# Patient Record
Sex: Female | Born: 1940 | Race: Black or African American | Hispanic: No | Smoking: Former smoker
Health system: Southern US, Community
[De-identification: ages and names within clinical notes are randomized; demographics above are authoritative.]

## PROBLEM LIST (undated history)

## (undated) DIAGNOSIS — I10 Essential (primary) hypertension: Secondary | ICD-10-CM

## (undated) HISTORY — PX: KNEE REPAIR EXTENSOR MECHANISM: SHX6613

---

## 2018-06-04 ENCOUNTER — Emergency Department (HOSPITAL_COMMUNITY): Payer: No Typology Code available for payment source

## 2018-06-04 ENCOUNTER — Encounter (HOSPITAL_COMMUNITY): Payer: Self-pay | Admitting: Emergency Medicine

## 2018-06-04 ENCOUNTER — Emergency Department (HOSPITAL_COMMUNITY)
Admission: EM | Admit: 2018-06-04 | Discharge: 2018-06-04 | Disposition: A | Discharge status: Home / Self Care | Payer: No Typology Code available for payment source | Attending: Emergency Medicine | Admitting: Emergency Medicine

## 2018-06-04 DIAGNOSIS — Y999 Unspecified external cause status: Secondary | ICD-10-CM | POA: Diagnosis not present

## 2018-06-04 DIAGNOSIS — Y92410 Unspecified street and highway as the place of occurrence of the external cause: Secondary | ICD-10-CM | POA: Insufficient documentation

## 2018-06-04 DIAGNOSIS — I10 Essential (primary) hypertension: Secondary | ICD-10-CM | POA: Diagnosis not present

## 2018-06-04 DIAGNOSIS — Z87891 Personal history of nicotine dependence: Secondary | ICD-10-CM | POA: Diagnosis not present

## 2018-06-04 DIAGNOSIS — I160 Hypertensive urgency: Secondary | ICD-10-CM | POA: Diagnosis not present

## 2018-06-04 DIAGNOSIS — S0990XA Unspecified injury of head, initial encounter: Secondary | ICD-10-CM | POA: Insufficient documentation

## 2018-06-04 DIAGNOSIS — Y939 Activity, unspecified: Secondary | ICD-10-CM | POA: Insufficient documentation

## 2018-06-04 DIAGNOSIS — Z96651 Presence of right artificial knee joint: Secondary | ICD-10-CM | POA: Insufficient documentation

## 2018-06-04 DIAGNOSIS — S8011XA Contusion of right lower leg, initial encounter: Secondary | ICD-10-CM

## 2018-06-04 DIAGNOSIS — S8991XA Unspecified injury of right lower leg, initial encounter: Secondary | ICD-10-CM | POA: Diagnosis present

## 2018-06-04 HISTORY — DX: Essential (primary) hypertension: I10

## 2018-06-04 LAB — CBC WITH DIFFERENTIAL/PLATELET
Abs Immature Granulocytes: 0 10*3/uL (ref 0.0–0.1)
BASOS ABS: 0 10*3/uL (ref 0.0–0.1)
Basophils Relative: 1 %
Eosinophils Absolute: 0 10*3/uL (ref 0.0–0.7)
Eosinophils Relative: 1 %
HEMATOCRIT: 38.1 % (ref 36.0–46.0)
Hemoglobin: 12.3 g/dL (ref 12.0–15.0)
Immature Granulocytes: 0 %
LYMPHS PCT: 16 %
Lymphs Abs: 1 10*3/uL (ref 0.7–4.0)
MCH: 27.6 pg (ref 26.0–34.0)
MCHC: 32.3 g/dL (ref 30.0–36.0)
MCV: 85.4 fL (ref 78.0–100.0)
MONO ABS: 0.3 10*3/uL (ref 0.1–1.0)
Monocytes Relative: 5 %
NEUTROS ABS: 4.9 10*3/uL (ref 1.7–7.7)
Neutrophils Relative %: 77 %
PLATELETS: 135 10*3/uL — AB (ref 150–400)
RBC: 4.46 MIL/uL (ref 3.87–5.11)
RDW: 16.8 % — ABNORMAL HIGH (ref 11.5–15.5)
WBC: 6.3 10*3/uL (ref 4.0–10.5)

## 2018-06-04 LAB — BASIC METABOLIC PANEL
Anion gap: 10 (ref 5–15)
BUN: 18 mg/dL (ref 8–23)
CALCIUM: 9.4 mg/dL (ref 8.9–10.3)
CO2: 26 mmol/L (ref 22–32)
Chloride: 104 mmol/L (ref 98–111)
Creatinine, Ser: 0.79 mg/dL (ref 0.44–1.00)
GFR calc Af Amer: >60 mL/min (ref >60)
GLUCOSE: 131 mg/dL — AB (ref 70–99)
Potassium: 4.5 mmol/L (ref 3.5–5.1)
Sodium: 140 mmol/L (ref 135–145)

## 2018-06-04 MED ORDER — ATENOLOL 50 MG PO TABS
50.0000 mg | ORAL_TABLET | Freq: Once | ORAL | Status: AC
  Administered 2018-06-04: 50 mg via ORAL
  Filled 2018-06-04: qty 1

## 2018-06-04 MED ORDER — HYDRALAZINE HCL 20 MG/ML IJ SOLN
10.0000 mg | Freq: Once | INTRAMUSCULAR | Status: AC
  Administered 2018-06-04: 10 mg via INTRAVENOUS
  Filled 2018-06-04: qty 1

## 2018-06-04 MED ORDER — OXYCODONE-ACETAMINOPHEN 5-325 MG PO TABS
1.0000 | ORAL_TABLET | Freq: Once | ORAL | Status: AC
  Administered 2018-06-04: 1 via ORAL
  Filled 2018-06-04: qty 1

## 2018-06-04 MED ORDER — CLONIDINE HCL 0.1 MG PO TABS
0.1000 mg | ORAL_TABLET | Freq: Once | ORAL | Status: AC
  Administered 2018-06-04: 0.1 mg via ORAL
  Filled 2018-06-04: qty 1

## 2018-06-04 MED ORDER — CLONIDINE HCL 0.2 MG PO TABS
0.2000 mg | ORAL_TABLET | Freq: Once | ORAL | Status: AC
  Administered 2018-06-04: 0.2 mg via ORAL
  Filled 2018-06-04: qty 1

## 2018-06-04 MED ORDER — HYDRALAZINE HCL 20 MG/ML IJ SOLN
5.0000 mg | Freq: Once | INTRAMUSCULAR | Status: DC
Start: 2018-06-04 — End: 2018-06-04
  Filled 2018-06-04: qty 1

## 2018-06-04 MED ORDER — NAPROXEN 500 MG PO TABS: 500.0000 mg | ORAL_TABLET | Freq: Two times a day (BID) | ORAL | 0 refills | Status: AC

## 2018-06-04 NOTE — Discharge Instructions (Signed)
Your x-rays are negative.  Take your blood pressure medication as prescribed and follow-up with your primary doctor.  Return to the ED if you develop new or worsening symptoms.

## 2018-06-04 NOTE — ED Notes (Signed)
Pt. Ambulated to the bathroom independently. 
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
